# Patient Record
Sex: Female | Born: 1935 | Race: Black or African American | Smoking: Former smoker
Health system: Southern US, Community
[De-identification: ages and names within clinical notes are randomized; demographics above are authoritative.]

## PROBLEM LIST (undated history)

## (undated) DIAGNOSIS — K219 Gastro-esophageal reflux disease without esophagitis: Secondary | ICD-10-CM

## (undated) DIAGNOSIS — I219 Acute myocardial infarction, unspecified: Secondary | ICD-10-CM

## (undated) DIAGNOSIS — F419 Anxiety disorder, unspecified: Secondary | ICD-10-CM

## (undated) DIAGNOSIS — J309 Allergic rhinitis, unspecified: Secondary | ICD-10-CM

## (undated) DIAGNOSIS — M069 Rheumatoid arthritis, unspecified: Secondary | ICD-10-CM

## (undated) DIAGNOSIS — E785 Hyperlipidemia, unspecified: Secondary | ICD-10-CM

## (undated) DIAGNOSIS — G4700 Insomnia, unspecified: Secondary | ICD-10-CM

## (undated) DIAGNOSIS — E119 Type 2 diabetes mellitus without complications: Secondary | ICD-10-CM

## (undated) DIAGNOSIS — I82409 Acute embolism and thrombosis of unspecified deep veins of unspecified lower extremity: Secondary | ICD-10-CM

## (undated) DIAGNOSIS — I10 Essential (primary) hypertension: Secondary | ICD-10-CM

## (undated) DIAGNOSIS — I509 Heart failure, unspecified: Secondary | ICD-10-CM

## (undated) DIAGNOSIS — J449 Chronic obstructive pulmonary disease, unspecified: Secondary | ICD-10-CM

## (undated) DIAGNOSIS — I2510 Atherosclerotic heart disease of native coronary artery without angina pectoris: Secondary | ICD-10-CM

## (undated) DIAGNOSIS — M109 Gout, unspecified: Secondary | ICD-10-CM

## (undated) DIAGNOSIS — F329 Major depressive disorder, single episode, unspecified: Secondary | ICD-10-CM

## (undated) DIAGNOSIS — F32A Depression, unspecified: Secondary | ICD-10-CM

## (undated) DIAGNOSIS — J45909 Unspecified asthma, uncomplicated: Secondary | ICD-10-CM

## (undated) HISTORY — DX: Type 2 diabetes mellitus without complications: E11.9

## (undated) HISTORY — PX: BUNIONECTOMY: SHX129

## (undated) HISTORY — DX: Heart failure, unspecified: I50.9

## (undated) HISTORY — DX: Gout, unspecified: M10.9

## (undated) HISTORY — DX: Depression, unspecified: F32.A

## (undated) HISTORY — DX: Hyperlipidemia, unspecified: E78.5

## (undated) HISTORY — PX: OTHER SURGICAL HISTORY: SHX169

## (undated) HISTORY — DX: Unspecified asthma, uncomplicated: J45.909

## (undated) HISTORY — DX: Atherosclerotic heart disease of native coronary artery without angina pectoris: I25.10

## (undated) HISTORY — PX: COLONOSCOPY: SHX174

## (undated) HISTORY — DX: Acute embolism and thrombosis of unspecified deep veins of unspecified lower extremity: I82.409

## (undated) HISTORY — DX: Anxiety disorder, unspecified: F41.9

## (undated) HISTORY — DX: Acute myocardial infarction, unspecified: I21.9

## (undated) HISTORY — PX: TUBAL LIGATION: SHX77

## (undated) HISTORY — DX: Chronic obstructive pulmonary disease, unspecified: J44.9

## (undated) HISTORY — DX: Major depressive disorder, single episode, unspecified: F32.9

## (undated) HISTORY — PX: APPENDECTOMY: SHX54

## (undated) HISTORY — DX: Allergic rhinitis, unspecified: J30.9

## (undated) HISTORY — DX: Insomnia, unspecified: G47.00

## (undated) HISTORY — DX: Essential (primary) hypertension: I10

## (undated) HISTORY — DX: Gastro-esophageal reflux disease without esophagitis: K21.9

## (undated) HISTORY — PX: TONSILLECTOMY: SUR1361

## (undated) HISTORY — DX: Rheumatoid arthritis, unspecified: M06.9

---

## 2003-08-03 HISTORY — PX: BREAST LUMPECTOMY: SHX2

## 2012-05-19 ENCOUNTER — Ambulatory Visit (INDEPENDENT_AMBULATORY_CARE_PROVIDER_SITE_OTHER): Payer: Medicare Other | Admitting: Internal Medicine

## 2012-05-19 ENCOUNTER — Encounter: Payer: Self-pay | Admitting: Internal Medicine

## 2012-05-19 ENCOUNTER — Encounter: Payer: Self-pay | Admitting: Cardiology

## 2012-05-19 VITALS — BP 170/110 | HR 105 | Ht 65.0 in | Wt 126.4 lb

## 2012-05-19 DIAGNOSIS — I4891 Unspecified atrial fibrillation: Secondary | ICD-10-CM

## 2012-05-19 DIAGNOSIS — I701 Atherosclerosis of renal artery: Secondary | ICD-10-CM

## 2012-05-19 DIAGNOSIS — I10 Essential (primary) hypertension: Secondary | ICD-10-CM

## 2012-05-19 MED ORDER — AMLODIPINE BESYLATE 5 MG PO TABS: 5.0000 mg | ORAL_TABLET | Freq: Every day | ORAL | Status: AC

## 2012-05-19 MED ORDER — METOPROLOL SUCCINATE ER 50 MG PO TB24: 50.0000 mg | ORAL_TABLET | Freq: Every day | ORAL | Status: AC

## 2012-05-19 NOTE — Progress Notes (Signed)
HPI Patient is a 78 year old with a history of afib, CHF, CAD, COPD, rheumatoid arthritis.   She is followed by Katy Fitch FNP.  Has been seen in [place] by a cardiologist in past.  Says she has CAD but says she has never had heart catheterization.  Tried to make appt but with change in insurance can no longer be seen. She notes occasional chest heaviness.  No PND  Occasionally feels heart race, esp if She has a history of falling and is not considered a safe candidate for anticoagulaion.  She says she falls all the time.  Allergies no known allergies  Current Outpatient Prescriptions  Medication Sig Dispense Refill  . albuterol-ipratropium (COMBIVENT) 18-103 MCG/ACT inhaler Inhale 2 puffs into the lungs every 6 (six) hours as needed.      . cetirizine (ZYRTEC) 10 MG tablet Take 10 mg by mouth daily.      . Cholecalciferol (VITAMIN D) 2000 UNITS CAPS Take 2,000 Units by mouth daily.      Marland Kitchen dexamethasone (DECADRON) 0.75 MG tablet Take 1.5 mg by mouth daily with breakfast.      . diazepam (VALIUM) 2 MG tablet Take 2 mg by mouth at bedtime as needed.      . fentaNYL (DURAGESIC - DOSED MCG/HR) 75 MCG/HR Place 1 patch onto the skin every 3 (three) days.      . Fluticasone-Salmeterol (ADVAIR) 250-50 MCG/DOSE AEPB Inhale 1 puff into the lungs every 12 (twelve) hours.      . furosemide (LASIX) 20 MG tablet Take 20 mg by mouth daily.      Marland Kitchen gabapentin (NEURONTIN) 300 MG capsule Take 300 mg by mouth 2 (two) times daily.      . metoprolol succinate (TOPROL-XL) 25 MG 24 hr tablet Take 25 mg by mouth daily.      Marland Kitchen oxybutynin (DITROPAN) 5 MG tablet Take 5 mg by mouth 2 (two) times daily.      Marland Kitchen oxyCODONE-acetaminophen (PERCOCET) 10-650 MG per tablet Take 1 tablet by mouth every 6 (six) hours as needed.      . pantoprazole (PROTONIX) 40 MG tablet Take 40 mg by mouth daily.      . potassium chloride (K-DUR,KLOR-CON) 10 MEQ tablet Take 10 mEq by mouth daily.      . sertraline (ZOLOFT) 50 MG tablet Take 50 mg  by mouth daily.      Marland Kitchen zolpidem (AMBIEN) 5 MG tablet Take 5 mg by mouth at bedtime as needed.        Past Medical History  Diagnosis Date  . Hypertension   . Hyperlipidemia   . Diabetes mellitus, type 2   . Depression   . Anxiety   . GERD (gastroesophageal reflux disease)   . Allergic rhinitis   . Asthma   . COPD (chronic obstructive pulmonary disease)   . CHF (congestive heart failure)     Diastolic dysfunction  . CAD (coronary artery disease)   . Insomnia   . Gout   . RA (rheumatoid arthritis)   . MI (myocardial infarction)     09/1999  . DVT (deep venous thrombosis)     2008    Past Surgical History  Procedure Date  . Colonoscopy   . Bunionectomy   . Appendectomy   . Bilateral carpal tunnel   . Catarct surgery   . Breast lumpectomy 2005  . Tonsillectomy   . Tubal ligation     No family history on file.  History   Social  History  . Marital Status: Unknown    Spouse Name: N/A    Number of Children: 2  . Years of Education: N/A   Occupational History  . Retired    Social History Main Topics  . Smoking status: Never Smoker   . Smokeless tobacco: Not on file  . Alcohol Use: No  . Drug Use: No  . Sexually Active: Not on file   Other Topics Concern  . Not on file   Social History Narrative  . No narrative on file    Review of Systems:  All systems reviewed.  They are negative to the above problem except as previously stated.  Vital Signs: BP is 170/110   P105  Wt 126 Lb.  Physical Exam Patient is a thin 78 year old in NAD  Examined in wheelchair. HEENT:  Normocephalic, atraumatic. EOMI, PERRLA.  Neck: JVP is normal.  No bruits.  Lungs: clear to auscultation. No rales no wheezes.  Heart: Irregular rate and rhythm. Normal S1, S2. No S3.   No significant murmurs. PMI not displaced.  Abdomen:  Supple, nontender. Normal bowel sounds. No masses. No hepatomegaly.  Extremities:   Good distal pulses throughout. No lower extremity edema.    Musculoskeletal :moving all extremities.  Neuro:   alert and oriented x3.  CN II-XII grossly intact.  EKG:   Atrial fibrillation  105 bpm.  Septal MI.   Assessment and Plan:  Patient is a 78 year old with multiple medical problems.  WIll get labs from [facility].  Will aslo get records from [facility]. She has longstanding afib.  Is not on coumadin or even an aspirin.  I would recomm 325 ecASA unless contraindicated for other reasons. Will schedule echo.  Consider Holter monitor but will review records first  2.  CAD.  Patient with reported history  Does have intermitt heaviness in chest.  I am not convinced angina, may reflect higher HR and diastolic dysfunction in setting of COPD Volume today looks good I would like to review outside records as well as echo.  3.  HTN  BP has been good until the past couple months.  Very high today.  WIll get Renal Artery USN.  Increase toprol to 50.  Add 5 Norvasc.  F/U in 4 wks.  4. COPD  Continue oxygen  5.  ?Dyslipidemia  WIll review outside labs.  Review of records from [facility] it appear that she has been conservative in Rx.  Will discuss all when get test results and records. Marland Kitchen

## 2012-05-19 NOTE — Patient Instructions (Addendum)
Your physician recommends that you schedule a follow-up appointment in: 1 month  Your physician has requested that you have an echocardiogram. Echocardiography is a painless test that uses sound waves to create images of your heart. It provides your doctor with information about the size and shape of your heart and how well your heart's chambers and valves are working. This procedure takes approximately one hour. There are no restrictions for this procedure.  Your physician has requested that you have a renal artery duplex. During this test, an ultrasound is used to evaluate blood flow to the kidneys. Allow one hour for this exam. Do not eat after midnight the day before and avoid carbonated beverages. Take your medications as you usually do.  Your physician has recommended you make the following change in your medication:  1 - INCREASE Metoprolol to 50 mg daily 2 - START Amlodipine 5 mg daily

## 2012-05-24 ENCOUNTER — Other Ambulatory Visit: Payer: Self-pay | Admitting: Cardiology

## 2012-05-24 DIAGNOSIS — I10 Essential (primary) hypertension: Secondary | ICD-10-CM

## 2012-05-29 ENCOUNTER — Encounter (INDEPENDENT_AMBULATORY_CARE_PROVIDER_SITE_OTHER): Payer: Medicare Other

## 2012-05-29 DIAGNOSIS — I10 Essential (primary) hypertension: Secondary | ICD-10-CM

## 2012-06-02 ENCOUNTER — Ambulatory Visit (HOSPITAL_COMMUNITY)
Admission: RE | Admit: 2012-06-02 | Discharge: 2012-06-02 | Disposition: A | Discharge status: Home / Self Care | Payer: Medicare Other | Source: Ambulatory Visit | Attending: Internal Medicine | Admitting: Internal Medicine

## 2012-06-02 DIAGNOSIS — I701 Atherosclerosis of renal artery: Secondary | ICD-10-CM | POA: Insufficient documentation

## 2012-06-02 DIAGNOSIS — I4891 Unspecified atrial fibrillation: Secondary | ICD-10-CM

## 2012-06-02 DIAGNOSIS — I10 Essential (primary) hypertension: Secondary | ICD-10-CM

## 2012-06-02 NOTE — Progress Notes (Signed)
*  PRELIMINARY RESULTS* Echocardiogram 2D Echocardiogram has been performed.  Breanna Reed 06/02/2012, 9:47 AM

## 2012-06-26 ENCOUNTER — Telehealth: Payer: Self-pay | Admitting: *Deleted

## 2012-06-26 MED ORDER — ATORVASTATIN CALCIUM 10 MG PO TABS: 10.0000 mg | ORAL_TABLET | Freq: Every day | ORAL | Status: AC

## 2012-06-26 NOTE — Telephone Encounter (Signed)
Called patient with follow up from lab work results from Dr. Ninfa Linden from 03/29/2012 and advised that she needs to start Lipitor 10mg  every day and have repeat fasting labs/ast in 8 weeks per Dr.Ross. She will set up lab appointment at the December appointment in [facility].

## 2012-07-07 ENCOUNTER — Ambulatory Visit: Payer: Medicare Other | Admitting: Internal Medicine

## 2012-08-03 ENCOUNTER — Telehealth: Payer: Self-pay | Admitting: Internal Medicine

## 2012-08-03 ENCOUNTER — Ambulatory Visit: Payer: Medicare Other | Admitting: Internal Medicine

## 2012-08-03 NOTE — Telephone Encounter (Signed)
Patient no showed appointment.  Called patient to reschedule.  No answer and no machine. Letter mailed. / tgs

## 2012-09-05 ENCOUNTER — Encounter: Payer: Self-pay | Admitting: Internal Medicine

## 2012-11-03 ENCOUNTER — Ambulatory Visit (INDEPENDENT_AMBULATORY_CARE_PROVIDER_SITE_OTHER): Payer: Medicare Other | Admitting: Internal Medicine

## 2012-11-03 ENCOUNTER — Encounter: Payer: Self-pay | Admitting: Internal Medicine

## 2012-11-03 VITALS — BP 134/83 | HR 79 | Ht 65.0 in | Wt 128.8 lb

## 2012-11-03 DIAGNOSIS — I25810 Atherosclerosis of coronary artery bypass graft(s) without angina pectoris: Secondary | ICD-10-CM

## 2012-11-03 DIAGNOSIS — I10 Essential (primary) hypertension: Secondary | ICD-10-CM

## 2012-11-03 DIAGNOSIS — R55 Syncope and collapse: Secondary | ICD-10-CM

## 2012-11-03 NOTE — Progress Notes (Addendum)
HPI Patient is a 79 year old with a history of afib, CHF, CAD, COPD, rheumatoid arthritis Echo in November showed normal LV systolic function.   SInce I saw her she was hosp at [facility].  She was d/c'd 10/10/12.  Reports she had a blood clot in her lung  Also a blood clot in her leg.  Had surgery  Now on Xarelto.   She says she gets chest pressure a few times per month.   Alos feels her heart pounding a few times per week She has had a few syncopal spells  Last was a few months ago  Sitting doing puzzle  Woke up on floor.  No prodrome Aonther spell she was walking to [place].   No records available from [facility] Allergies  Allergen Reactions  . Prednisone     vomitting,diarrhea    Current Outpatient Prescriptions  Medication Sig Dispense Refill  . albuterol-ipratropium (COMBIVENT) 18-103 MCG/ACT inhaler Inhale 2 puffs into the lungs every 6 (six) hours as needed.      Marland Kitchen amLODipine (NORVASC) 5 MG tablet Take 1 tablet (5 mg total) by mouth daily.  30 tablet  6  . atorvastatin (LIPITOR) 10 MG tablet Take 1 tablet (10 mg total) by mouth daily.  30 tablet  6  . cetirizine (ZYRTEC) 10 MG tablet Take 10 mg by mouth daily.      . Cholecalciferol (VITAMIN D) 2000 UNITS CAPS Take 2,000 Units by mouth daily.      Marland Kitchen dexamethasone (DECADRON) 0.75 MG tablet Take 1.5 mg by mouth daily with breakfast.      . diazepam (VALIUM) 2 MG tablet Take 2 mg by mouth at bedtime as needed.      . fentaNYL (DURAGESIC - DOSED MCG/HR) 75 MCG/HR Place 1 patch onto the skin every 3 (three) days.      . Fluticasone-Salmeterol (ADVAIR) 250-50 MCG/DOSE AEPB Inhale 1 puff into the lungs every 12 (twelve) hours.      . furosemide (LASIX) 20 MG tablet Take 20 mg by mouth daily.      Marland Kitchen gabapentin (NEURONTIN) 300 MG capsule Take 300 mg by mouth 2 (two) times daily.      Marland Kitchen HYDROcodone-acetaminophen (NORCO/VICODIN) 5-325 MG per tablet       . lidocaine (LIDODERM) 5 % Place 1 patch onto the skin as needed. Remove & Discard  patch within 12 hours or as directed by MD      . metoprolol succinate (TOPROL-XL) 50 MG 24 hr tablet Take 1 tablet (50 mg total) by mouth daily.  30 tablet  6  . oxybutynin (DITROPAN) 5 MG tablet Take 5 mg by mouth 2 (two) times daily.      Marland Kitchen oxyCODONE-acetaminophen (PERCOCET) 10-650 MG per tablet Take 1 tablet by mouth every 6 (six) hours as needed.      . pantoprazole (PROTONIX) 40 MG tablet Take 40 mg by mouth daily.      . potassium chloride (K-DUR,KLOR-CON) 10 MEQ tablet Take 10 mEq by mouth daily.      . sertraline (ZOLOFT) 50 MG tablet Take 50 mg by mouth daily.      Marland Kitchen warfarin (COUMADIN) 4 MG tablet Take 4 mg by mouth daily.       Carlena Hurl 15 MG TABS tablet       . zolpidem (AMBIEN) 5 MG tablet Take 5 mg by mouth at bedtime as needed.       No current facility-administered medications for this visit.  Past Medical History  Diagnosis Date  . Hypertension   . Hyperlipidemia   . Diabetes mellitus, type 2   . Depression   . Anxiety   . GERD (gastroesophageal reflux disease)   . Allergic rhinitis   . Asthma   . COPD (chronic obstructive pulmonary disease)   . CHF (congestive heart failure)     Diastolic dysfunction  . CAD (coronary artery disease)   . Insomnia   . Gout   . RA (rheumatoid arthritis)   . MI (myocardial infarction)     09/1999  . DVT (deep venous thrombosis)     2008    Past Surgical History  Procedure Laterality Date  . Colonoscopy    . Bunionectomy    . Appendectomy    . Bilateral carpal tunnel    . Catarct surgery    . Breast lumpectomy  2005  . Tonsillectomy    . Tubal ligation      No family history on file.  History   Social History  . Marital Status: Widowed    Spouse Name: N/A    Number of Children: 2  . Years of Education: N/A   Occupational History  . Retired    Social History Main Topics  . Smoking status: Former Smoker -- 0.50 packs/day for 29 years    Types: Cigarettes    Quit date: 08/03/1979  . Smokeless tobacco: Not  on file  . Alcohol Use: No  . Drug Use: No  . Sexually Active: Not on file   Other Topics Concern  . Not on file   Social History Narrative  . No narrative on file    Review of Systems:  All systems reviewed.  They are negative to the above problem except as previously stated.  Vital Signs: BP 134/83  Pulse 79  Ht 5\' 5"  (1.651 m)  Wt 128 lb 12 oz (58.401 kg)  BMI 21.43 kg/m2  SpO2 96%  Physical Exam Patient is in NAD HEENT:  Normocephalic, atraumatic. EOMI, PERRLA.  Neck: JVP is normal.  No bruits.  Lungs: clear to auscultation. No rales no wheezes.  Heart: Regular rate and rhythm. Normal S1, S2. No S3.   No significant murmurs. PMI not displaced.  Abdomen:  Supple, nontender. Normal bowel sounds. No masses. No hepatomegaly.  Extremities:   Good distal pulses throughout. No lower extremity edema.  Musculoskeletal :moving all extremities.  Neuro:   alert and oriented x3.  CN II-XII grossly intact. EKG:  SR with frequent PACs 83 bpm   Anteroseptal MI  Assessment and Plan:  Patient was recently hospitalized at [facility] not available at visit [facility] currently denies CP  Breathing is stable. BP is adequately controlled.  Need to get records to review hospital course.  I would not change medical regimen.  Take activity as tolerated.   11/07/12  Reviewed records from [facility]  Patient admitted 3/4 with pain LLE  With cold LLE Found to have thrombosis of leg Underwent revasc with stent.  Hypercoag w/u was negative.  Echo showed normal LV systolic function.  Mild MR, mild TR, mild AI  Mild diastolic dysfunction.  I would keep on regimen. Noted to have FE defic anemia  WIl need to be followed.

## 2012-11-03 NOTE — Patient Instructions (Addendum)
Your physician recommends that you schedule a follow-up appointment in: Dr Tenny Craw will call with follow up

## 2012-11-06 NOTE — Addendum Note (Signed)
Addended by: Cynda Acres A on: 11/06/2012 10:15 AM   Modules accepted: Orders

## 2012-11-07 ENCOUNTER — Encounter: Payer: Self-pay | Admitting: *Deleted

## 2012-11-07 ENCOUNTER — Other Ambulatory Visit: Payer: Self-pay | Admitting: *Deleted

## 2012-11-07 DIAGNOSIS — D649 Anemia, unspecified: Secondary | ICD-10-CM

## 2013-01-12 ENCOUNTER — Other Ambulatory Visit: Payer: Self-pay | Admitting: Internal Medicine

## 2013-01-13 LAB — CBC
Hemoglobin: 10.4 g/dL — ABNORMAL LOW (ref 12.0–15.0)
MCH: 30.9 pg (ref 26.0–34.0)
MCV: 95.8 fL (ref 78.0–100.0)
Platelets: 260 10*3/uL (ref 150–400)
RBC: 3.37 MIL/uL — ABNORMAL LOW (ref 3.87–5.11)
WBC: 11.4 10*3/uL — ABNORMAL HIGH (ref 4.0–10.5)

## 2013-01-13 LAB — IRON AND TIBC
TIBC: 336 ug/dL (ref 250–470)
UIBC: 269 ug/dL (ref 125–400)

## 2013-01-13 LAB — FERRITIN: Ferritin: 55 ng/mL (ref 10–291)

## 2013-02-05 ENCOUNTER — Ambulatory Visit: Payer: Medicare Other | Admitting: Internal Medicine

## 2013-03-11 ENCOUNTER — Inpatient Hospital Stay: Payer: Self-pay | Admitting: Internal Medicine

## 2013-03-11 LAB — CBC WITH DIFFERENTIAL/PLATELET
Basophil %: 0.2 %
Eosinophil #: 0 10*3/uL (ref 0.0–0.7)
Eosinophil %: 0.2 %
HGB: 8.9 g/dL — ABNORMAL LOW (ref 12.0–16.0)
Lymphocyte #: 0.7 10*3/uL — ABNORMAL LOW (ref 1.0–3.6)
MCV: 89 fL (ref 80–100)
Monocyte #: 0.7 x10 3/mm (ref 0.2–0.9)
Neutrophil #: 17.8 10*3/uL — ABNORMAL HIGH (ref 1.4–6.5)
Platelet: 338 10*3/uL (ref 150–440)

## 2013-03-11 LAB — COMPREHENSIVE METABOLIC PANEL
Albumin: 2.5 g/dL — ABNORMAL LOW (ref 3.4–5.0)
Alkaline Phosphatase: 127 U/L (ref 50–136)
BUN: 25 mg/dL — ABNORMAL HIGH (ref 7–18)
Chloride: 105 mmol/L (ref 98–107)
Creatinine: 2.34 mg/dL — ABNORMAL HIGH (ref 0.60–1.30)
EGFR (Non-African Amer.): 19 — ABNORMAL LOW
Osmolality: 280 (ref 275–301)
Potassium: 5.1 mmol/L (ref 3.5–5.1)
SGPT (ALT): 9 U/L — ABNORMAL LOW (ref 12–78)
Sodium: 137 mmol/L (ref 136–145)

## 2013-03-11 LAB — URINALYSIS, COMPLETE
Bacteria: NONE SEEN
Bilirubin,UR: NEGATIVE
Blood: NEGATIVE
Glucose,UR: NEGATIVE mg/dL (ref 0–75)
Ketone: NEGATIVE
Nitrite: NEGATIVE
Ph: 5 (ref 4.5–8.0)
Protein: NEGATIVE
Squamous Epithelial: 1

## 2013-03-11 LAB — CBC
HCT: 29.1 % — ABNORMAL LOW (ref 35.0–47.0)
HGB: 9.8 g/dL — ABNORMAL LOW (ref 12.0–16.0)
MCH: 29.6 pg (ref 26.0–34.0)
MCV: 88 fL (ref 80–100)
Platelet: 499 10*3/uL — ABNORMAL HIGH (ref 150–440)
RBC: 3.3 10*6/uL — ABNORMAL LOW (ref 3.80–5.20)
RDW: 16.4 % — ABNORMAL HIGH (ref 11.5–14.5)

## 2013-03-11 LAB — TROPONIN I: Troponin-I: <0.02 ng/mL

## 2013-03-11 LAB — BASIC METABOLIC PANEL
Anion Gap: 14 (ref 7–16)
Chloride: 110 mmol/L — ABNORMAL HIGH (ref 98–107)
Co2: 16 mmol/L — ABNORMAL LOW (ref 21–32)
EGFR (Non-African Amer.): 23 — ABNORMAL LOW
Glucose: 139 mg/dL — ABNORMAL HIGH (ref 65–99)
Osmolality: 285 (ref 275–301)
Potassium: 4.6 mmol/L (ref 3.5–5.1)
Sodium: 140 mmol/L (ref 136–145)

## 2013-03-12 LAB — URINE CULTURE

## 2013-03-12 LAB — MAGNESIUM: Magnesium: 1.7 mg/dL — ABNORMAL LOW

## 2013-03-12 LAB — CBC WITH DIFFERENTIAL/PLATELET
Basophil #: 0 10*3/uL (ref 0.0–0.1)
Basophil %: 0.4 %
HGB: 8 g/dL — ABNORMAL LOW (ref 12.0–16.0)
Lymphocyte %: 5.7 %
MCH: 29.8 pg (ref 26.0–34.0)
MCV: 88 fL (ref 80–100)
Monocyte #: 0.7 x10 3/mm (ref 0.2–0.9)
Monocyte %: 5.7 %
RBC: 2.68 10*6/uL — ABNORMAL LOW (ref 3.80–5.20)
RDW: 16.3 % — ABNORMAL HIGH (ref 11.5–14.5)
WBC: 13 10*3/uL — ABNORMAL HIGH (ref 3.6–11.0)

## 2013-03-12 LAB — BASIC METABOLIC PANEL
Calcium, Total: 7.9 mg/dL — ABNORMAL LOW (ref 8.5–10.1)
Chloride: 111 mmol/L — ABNORMAL HIGH (ref 98–107)
Co2: 18 mmol/L — ABNORMAL LOW (ref 21–32)
Creatinine: 1.36 mg/dL — ABNORMAL HIGH (ref 0.60–1.30)
EGFR (African American): 43 — ABNORMAL LOW
EGFR (Non-African Amer.): 37 — ABNORMAL LOW
Sodium: 142 mmol/L (ref 136–145)

## 2013-03-13 LAB — PROTIME-INR
INR: 2.4
Prothrombin Time: 25.3 secs — ABNORMAL HIGH (ref 11.5–14.7)

## 2013-03-14 LAB — PROTIME-INR: Prothrombin Time: 26.9 secs — ABNORMAL HIGH (ref 11.5–14.7)

## 2013-03-15 LAB — PROTIME-INR: INR: 2.9

## 2013-03-16 LAB — CULTURE, BLOOD (SINGLE)

## 2013-03-16 LAB — PROTIME-INR: Prothrombin Time: 24 secs — ABNORMAL HIGH (ref 11.5–14.7)

## 2013-04-24 ENCOUNTER — Encounter: Payer: Self-pay | Admitting: *Deleted

## 2014-11-10 IMAGING — CR DG CHEST 1V PORT
1 series · 1 of 1 positions shown · non-contrast
Comparison: none

REASON FOR EXAM: s/p intubation
COMMENTS:

[ap]
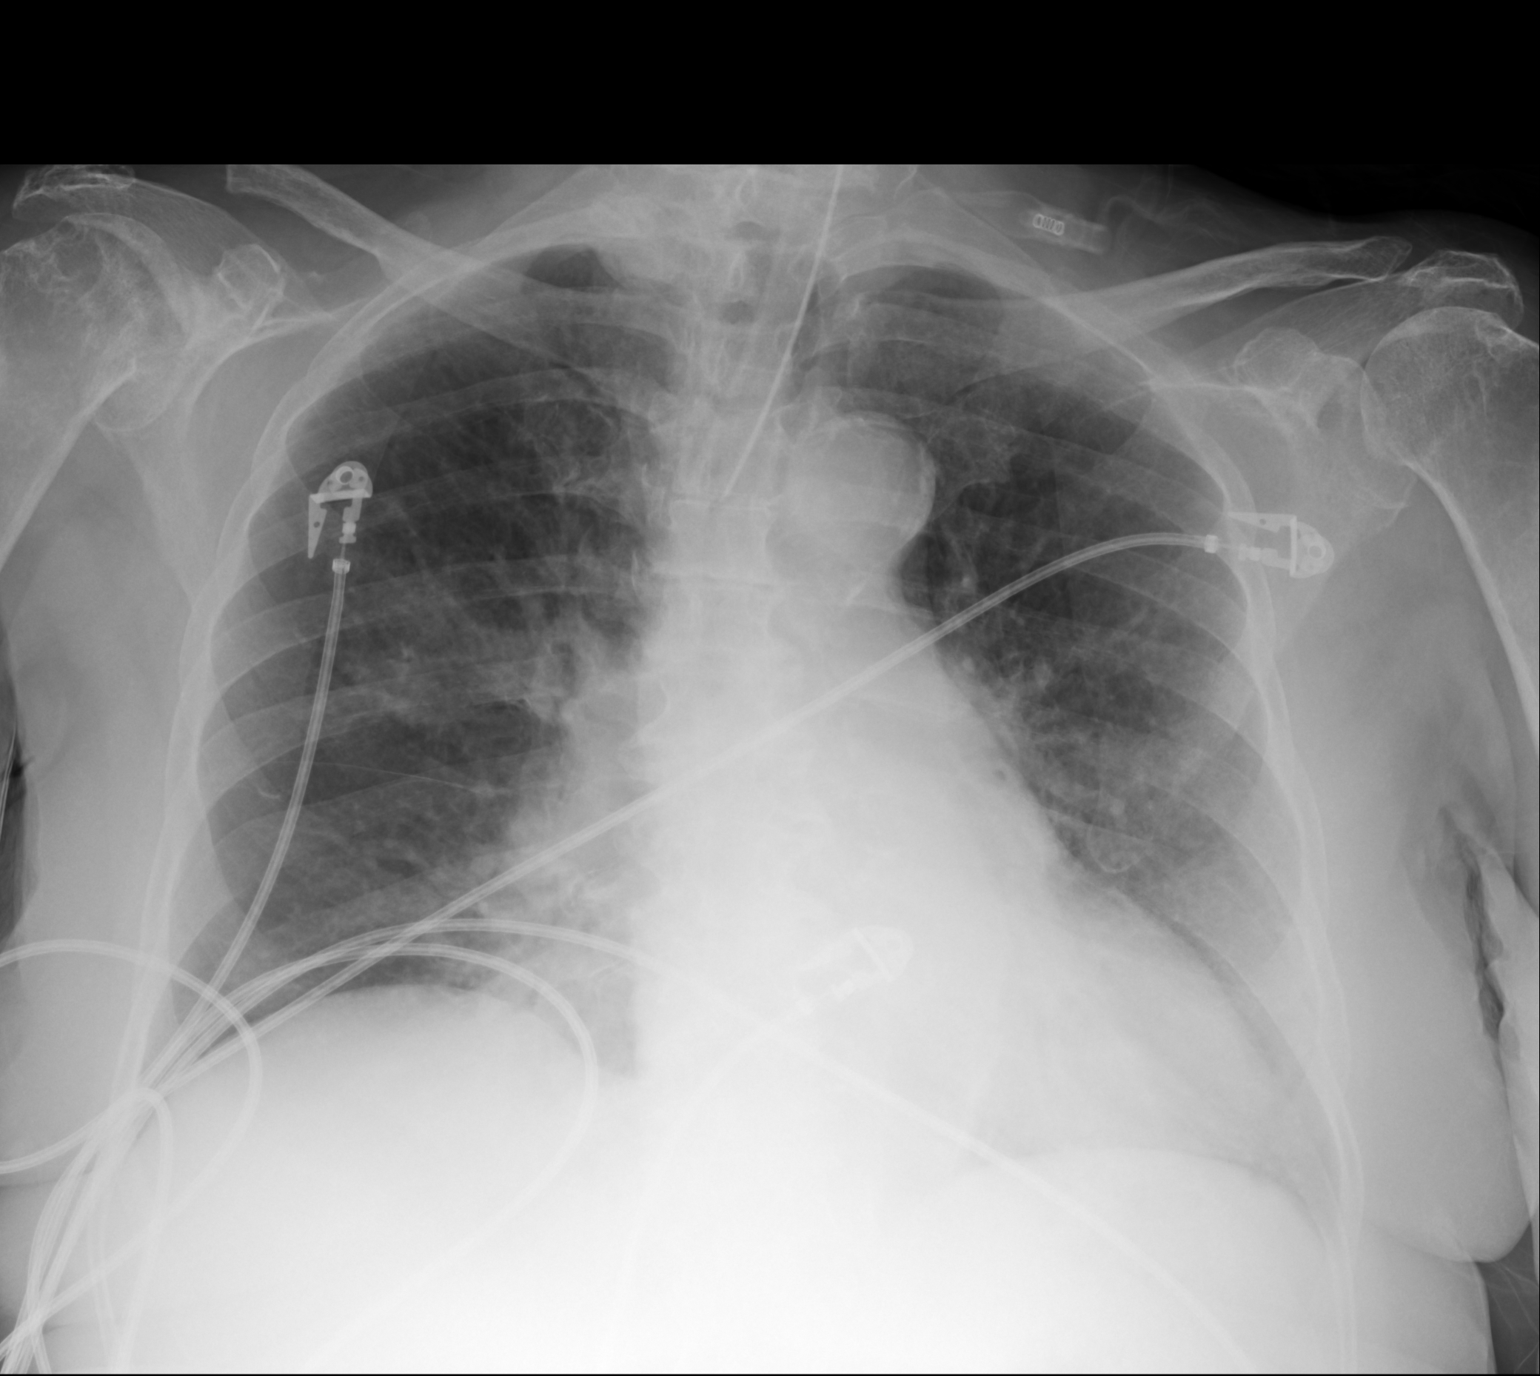

[1 of 1 positions shown; findings below may reference images not displayed]

PROCEDURE:     DXR - DXR PORTABLE CHEST SINGLE VIEW  - March 11, 2013  [DATE]

RESULT:     An endotracheal tube is in place. The tip lies approximately 1
cm below the inferior margin of the clavicular heads. The cardiac silhouette
is enlarged. There is increased density in the right perihilar region. There
is no pleural effusion or pneumothorax. There is tortuosity of the
descending thoracic aorta.
IMPRESSION: 1. The trachea is intubated with the tip of the tube approximately 1 cm
below the inferior margin of the clavicular heads an approximately 3 cm
above the carina.
2. There is mild enlargement of the cardiac silhouette with indistinct
pulmonary vascularity.
3. Confluent density in the right midlung is present. This could reflect
aspiration. Followup films would be useful to reassess the right lung.

[REDACTED]

## 2014-11-10 IMAGING — CR DG TIBIA/FIBULA 2V*L*
1 series · 2 of 2 positions shown · non-contrast
Comparison: none

REASON FOR EXAM: wound vac, concern for infection
COMMENTS:

[Series 1: ap · 0.17mm/px · 2 of 2 slices shown]
[im 1/2]
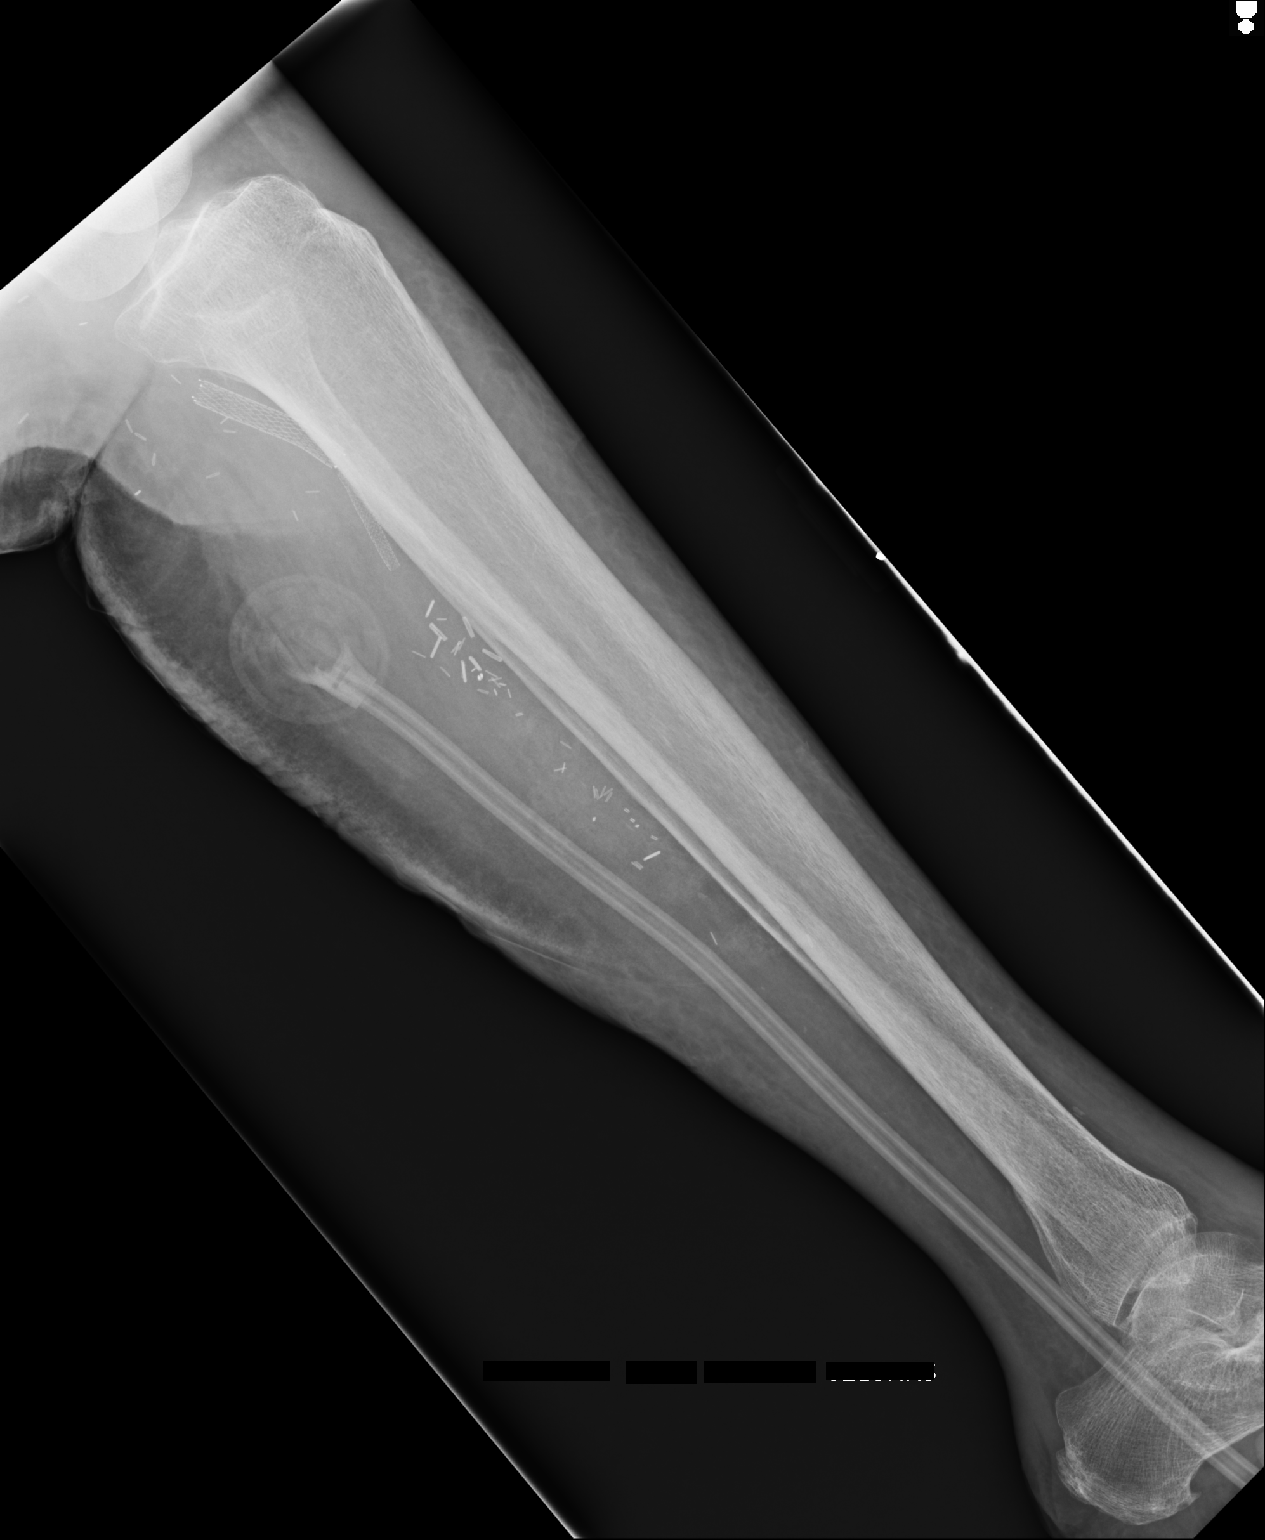
[im 2/2]
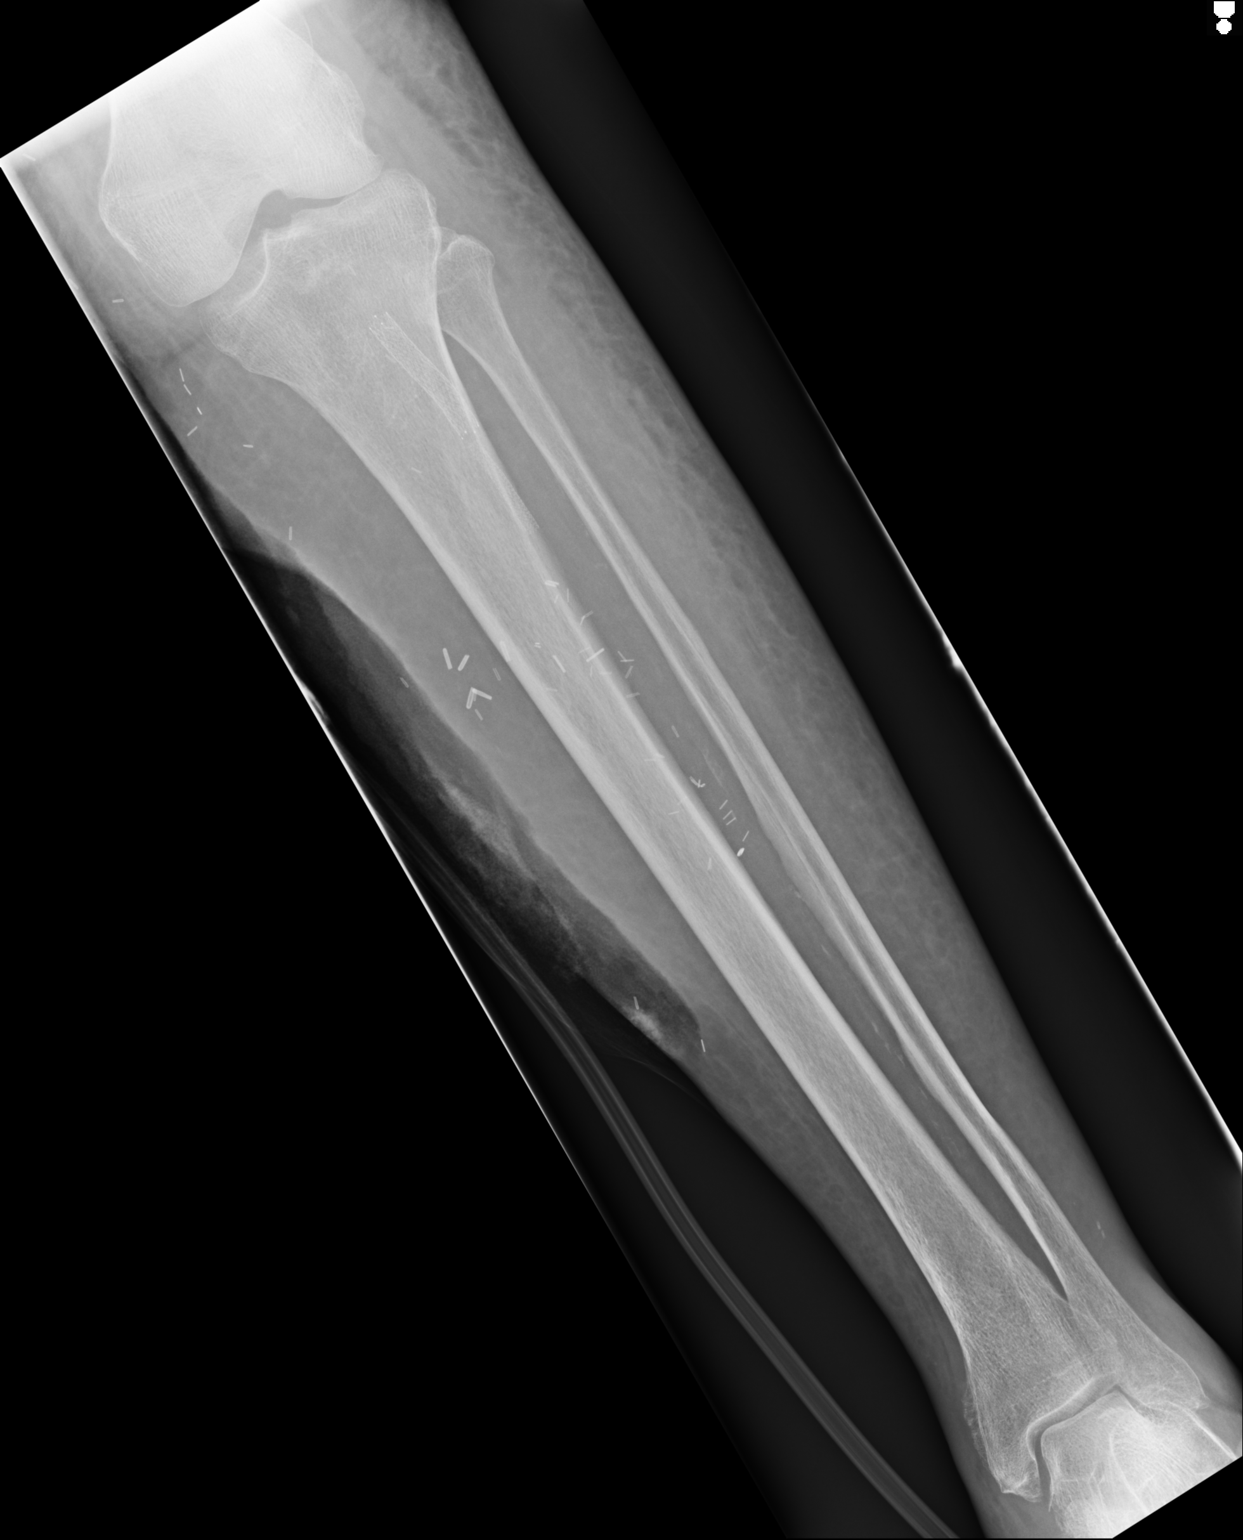

[2 of 2 positions shown; findings below may reference images not displayed]

PROCEDURE:     DXR - DXR TIBIA AND FIBULA LT (LOWER L  - March 11, 2013  [DATE]

RESULT:     AP and lateral views of the left tibia and fibula reveal the
bones to be adequately mineralized. There is no lytic or blastic lesion.
There is no evidence of a fracture. The patient has wound vacuum device in
place for treatment of significant soft tissue abnormality over the medial
aspect of the leg. Innumerable surgical vascular clips are present.
IMPRESSION: No acute bony abnormality of the left tibia or fibula is
demonstrated. The overlying soft tissues exhibit abnormality medially
consistent with cellulitis.

[REDACTED]

## 2014-11-10 IMAGING — CT CT HEAD WITHOUT CONTRAST
1 series · 15 of 30 positions shown, 19 images · non-contrast
Comparison: none

REASON FOR EXAM: ams
COMMENTS:

[Series 2: soft tissue · axial · 0.39mm/px · z∈[+2,+138]mm · 15 of 30 slices shown, 19 images]
[im 2/30  brain]
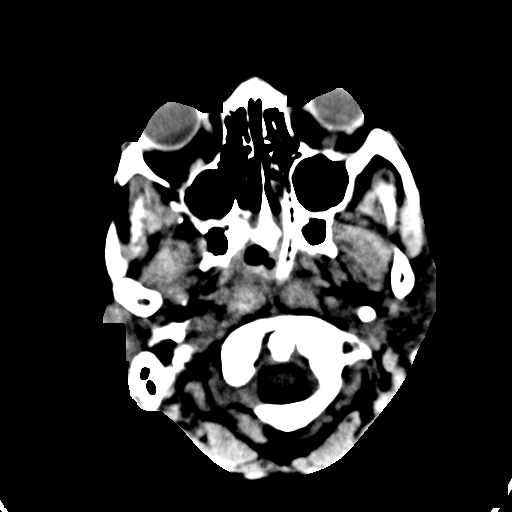
[im 2/30  bone]
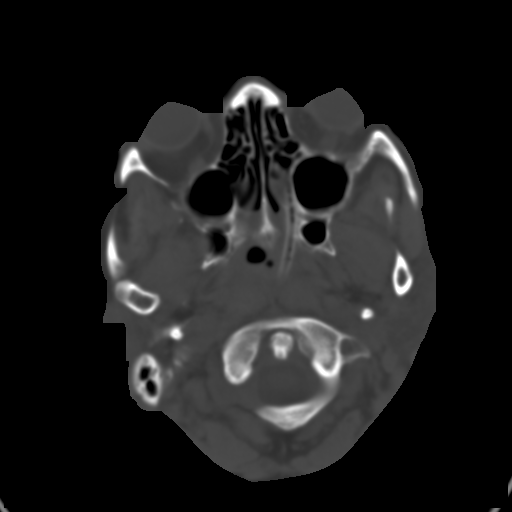
[im 4/30  brain]
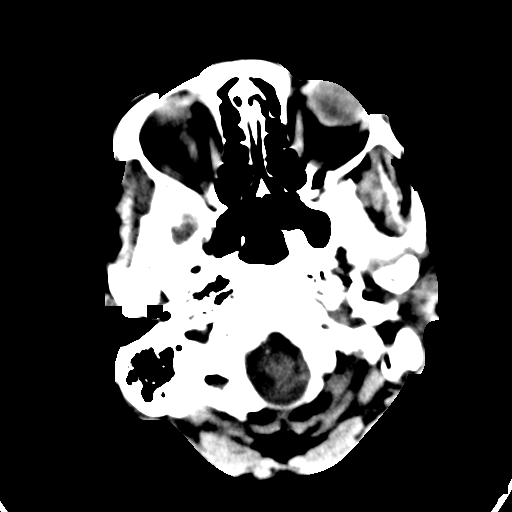
[im 6/30  brain]
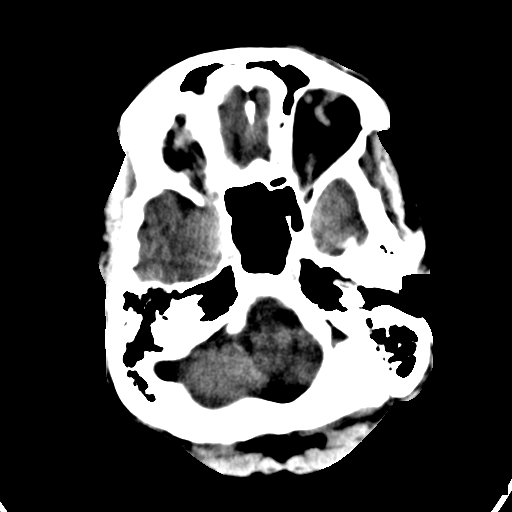
[im 8/30  brain]
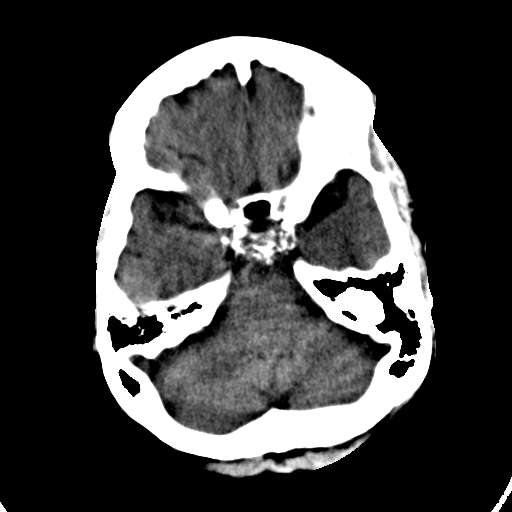
[im 10/30  brain]
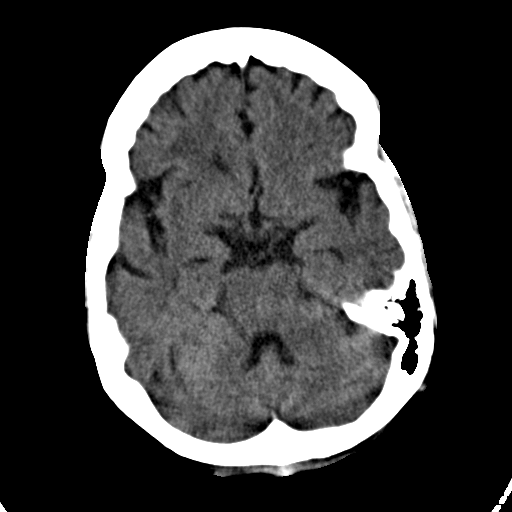
[im 10/30  bone]
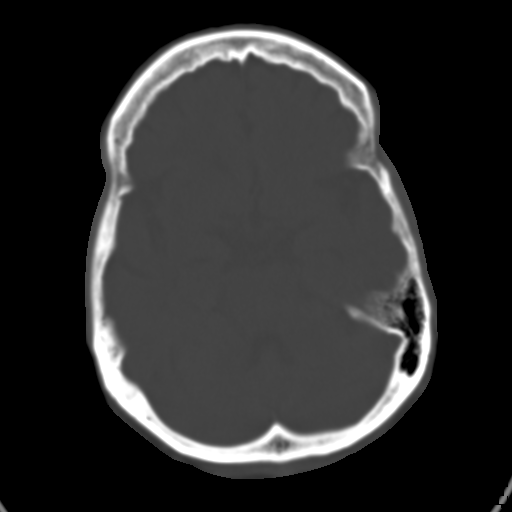
[im 12/30  brain]
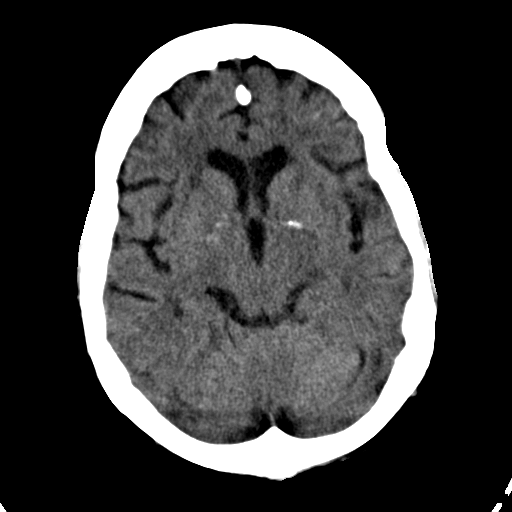
[im 14/30  brain]
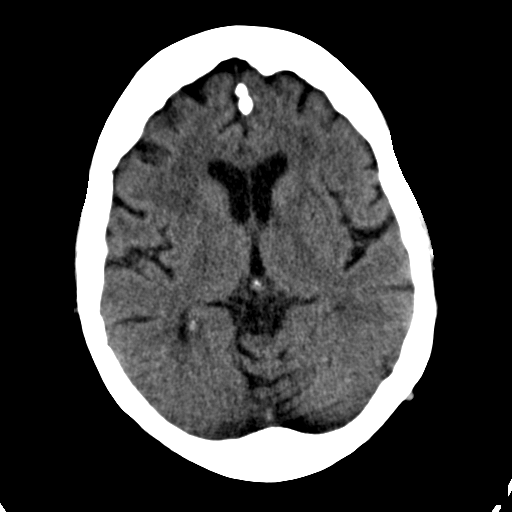
[im 16/30  brain]
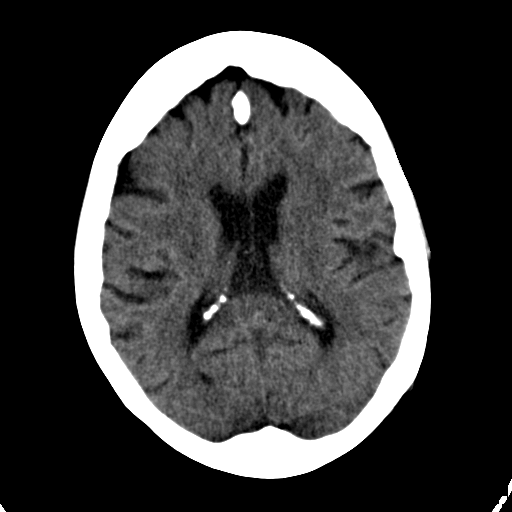
[im 17/30  brain]
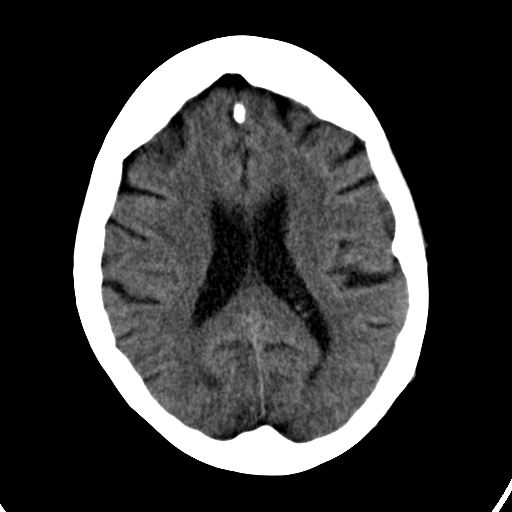
[im 17/30  bone]
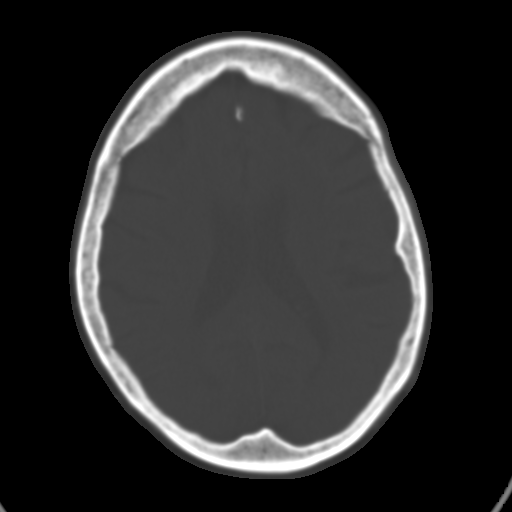
[im 19/30  brain]
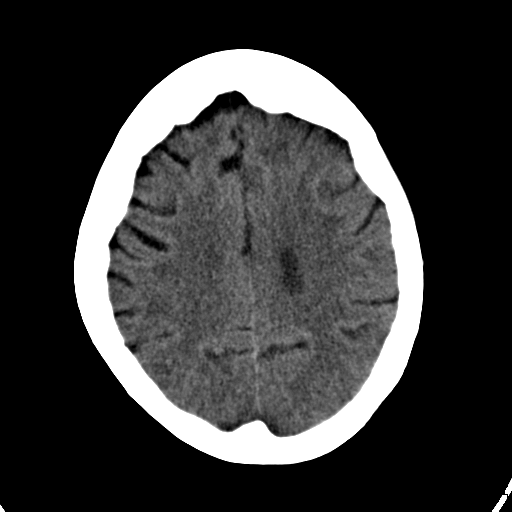
[im 21/30  brain]
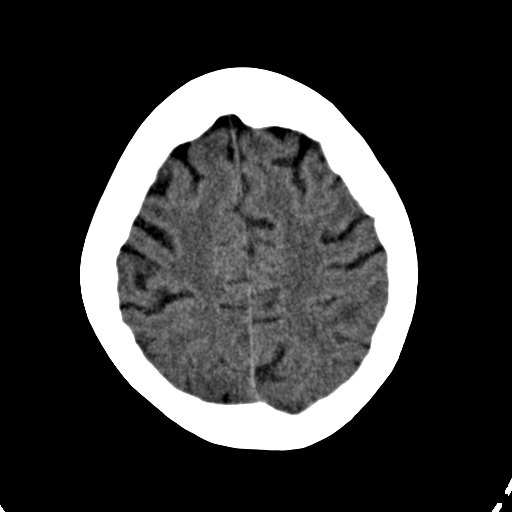
[im 23/30  brain]
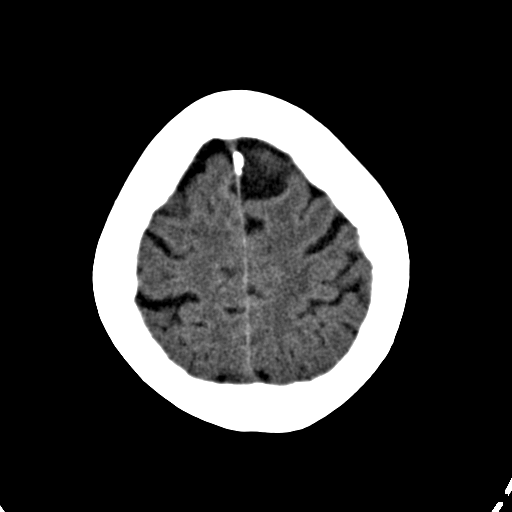
[im 25/30  brain]
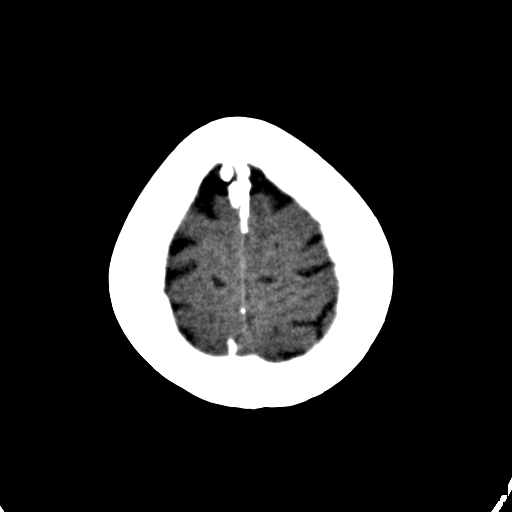
[im 25/30  bone]
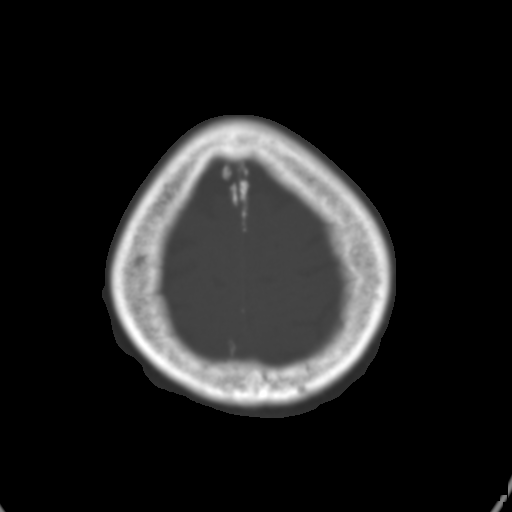
[im 27/30  brain]
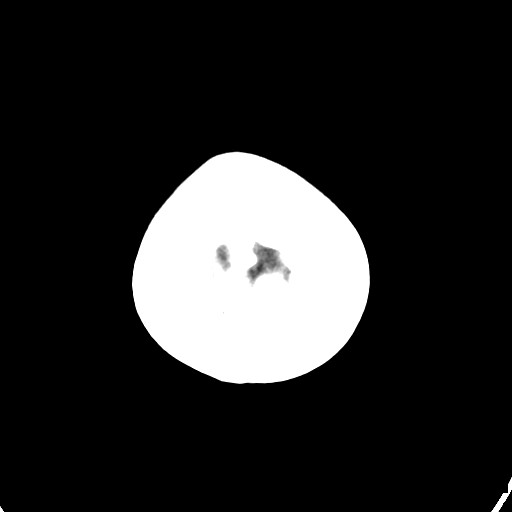
[im 29/30  brain]
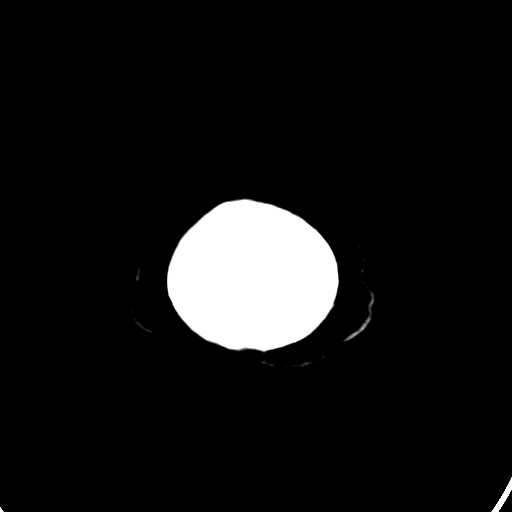

[15 of 30 positions shown; findings below may reference images not displayed]

PROCEDURE:     CT  - CT HEAD WITHOUT CONTRAST  - March 11, 2013  [DATE]

RESULT:     Axial noncontrast CT scanning was performed through the brain
with reconstructions at 5 mm intervals and slice thicknesses.

There is mild age-appropriate diffuse cerebral and cerebellar atrophy with
compensatory ventriculomegaly. There is no evidence of an acute intracranial
hemorrhage. There are no findings to suggest an evolving ischemic
infarction. The cerebellum and brainstem exhibit normal density. Is a small
amount of calcification in the anterior aspect of the falx cerebri which is
a normal finding.

At bone window settings there is a tubular structure in the right nasal
passage that may reflect a naso- gastric tube. There is fluid soft tissue
density in the posterior aspect of the nasal passages. The paranasal sinuses
and mastoid air cells are clear. There is no evidence of an acute skull
fracture.
IMPRESSION: 1. There are mild age related atrophic changes within the brain as well as
evidence of chronic small vessel ischemia. There is no evidence of an acute
intracranial hemorrhage.
2. There is no evidence of an acute skull fracture.
3. There is soft tissue density material in the posterior aspect of the
nasal passages likely related to the presence of the presumed nasogastric
tube.

A preliminary report was sent to the [HOSPITAL] the conclusion
of the study.

[REDACTED]

## 2014-11-22 NOTE — H&P (Signed)
PATIENT NAME:  Breanna Reed, [name] MR#:  [id number] DATE OF BIRTH:  1932/09/15  DATE OF ADMISSION:  03/11/2013  PRIMARY CARE PHYSICIAN:  At [facility].  Previous primary care [facility] is [facility].   SURGERY:  [facility].  REFERRING PHYSICIAN:  Dr. Cyril LoosenKinner.   CHIEF COMPLAINT:  Altered mental status.   HISTORY OF PRESENT ILLNESS:  The patient is a 81 year old African American female with a past medical history of diabetes mellitus type 2, COPD, hypertension, hyperlipidemia, recent history of peripheral vascular disease status post recent left femoral popliteal bypass surgery and wound vac on Coumadin for left lower leg ischemia, is sent over to the ER from the [facility] for altered mentation.  According to the nursing home staff and ER staff, the patient was found to be lethargic in the nursing home and at that time her pulse was between 100 to 130 and pulse ox was 70% to 80%.  Immediately paramedics were called and the patient was brought into the ER.  The patient was found to be unresponsive in the ER with agonal respirations.  She does not have any gag reflex and she was intubated immediately by the ER physician for airway protection.  Temperature was 102.3 and pulse was 120 initially with a blood pressure 133/80.  Blood culture was obtained and the patient is initiated.  The patient is started on Zosyn and vancomycin.  Hospitalist team is called to admit the patient.  The patient is started on Versed drip for sedation.  White count is elevated and INR is at 2.1.  A 12-lead EKG has revealed sinus tachycardia.  No family members are available at bedside and the nurse was Morrie Sheldonshley at [facility] is reporting that the patient is scheduled to have an appointment regarding wound debridement of the left lower extremity.  PAST MEDICAL HISTORY:  Diabetes mellitus type 2, COPD, tobacco use, hypertension, peripheral vascular disease.   PAST SURGICAL  HISTORY:  Recent history of left-sided femoral-popliteal bypass grafting on wound vac and Coumadin.   ALLERGIES:  The patient is allergic to PREDNISONE.   PSYCHOSOCIAL HISTORY:  Currently residing in [facility].   MEDICATIONS AT HOME:  Tylenol 325 mg 2 tablets by mouth q. 6 hours, Toprol XL 25 mg once daily, Sertraline 50 mg once daily, potassium supplements, pantoprazole 40 mg once daily, oxycodone 5 mg by mouth q. 4 hours as needed, Norvasc 10 mg once daily, gabapentin 300 mg 2 times a day, diltiazem 90 mg half tablet every six hours, Coumadin 4 mg once daily, Colace 100 mg 2 times a day, aspirin 81 mg once daily, albuterol inhalation every four hours as needed for shortness of breath.   FAMILY HISTORY:  Unobtainable.   REVIEW OF SYSTEMS:  Unobtainable.   PHYSICAL EXAMINATION: VITAL SIGNS:  Temperature initially 102.3, pulse 114, respirations 12, blood pressure is 132/60, pulse ox 100% on vent.  GENERAL APPEARANCE:  Not under acute distress.  The patient is on vent, sedated.  HEENT:  Pupils pinpoint, sluggishly responding to light.  Dry mucous membranes.  NECK:  Supple.  No swelling is noted.  LUNGS:  Moderate air entry with positive crackles.  CARDIAC:  S1, S2 normal.  Regular rate and rhythm.  GASTROINTESTINAL:  Soft.  Bowel sounds are positive in all four quadrants.  No masses felt.  No hepatosplenomegaly.  NEUROLOGIC:  The patient is sedated from Versed drip.  Reflexes are 2+.  Motor and sensory could not be elicited.  SKIN:  Dry.  Warm to touch.  Decreased turgor.  No lesions noticed.  EXTREMITIES:  Left lower extremity with intact wound vac.  No cyanosis.  No clubbing. PSYCHIATRIC:  Mood and affect cannot be examined as the patient is sedated.   LABORATORY AND IMAGING STUDIES:  A 12-lead EKG, sinus tachycardia with premature atrial complexes.  Left axis deviation.  Normal PR and QRS intervals.  Glucose is 132, BUN 25, creatinine 2.35, sodium 137, potassium 5.1, chloride  105, CO2 15, GFR is 23, anion gap 17, serum osmolality 218, calcium 8.6.  LFTs, albumin is low at 2.5.  The rest of the LFTs are okay.  Troponin less than 0.02.  WBC 17.8, hemoglobin 9.8, hematocrit 29.1, platelets 499, PT 23.2.  Activated PTT 41.7.  Urinalysis yellow in color, clear in appearance nitrite and leukocyte esterase are negative.  The ABG has revealed a lactic acid of 1.10, pH 7.19, pCO2 37, pO2 133, on 80% FiO2, base excess minus 13.3, bicarb is 14.1.  Chest x-ray, possible developing infiltrate.   ASSESSMENT AND PLAN:  A 81 year old African American female sent over from [facility] for being lethargic and hypoxic and became unresponsive in the ER with agonal breathing, will be admitted with the following assessment and plan.  1.  Altered mental status, acute on chronic respiratory failure, sepsis, metabolic encephalopathy and acute kidney injury.  We will admit her to Intensive Care Unit.  The patient is intubated on the vent.  Continue Versed drip.  Get ABGs in a.m.  She is going to be nothing by mouth.  We will provide her with IV fluids.  Pan cultures were ordered.  Start her on broad-spectrum antibiotics Zosyn, Levaquin and vancomycin.  Sepsis could be from the wound infection versus aspiration pneumonia, septicemia.  Pan cultures are obtained and broad-spectrum antibiotics were ordered and we will de-escalate the antibiotics after getting the culture results.  2.  Peripheral vascular disease, recent history of femoropopliteal bypass surgery on wound vac and Coumadin.  INR is 2.1.  We will get daily PT INRs regarding Coumadin management.  Surgical consult is placed for possible wound debridement.  3.  Acute on chronic respiratory failure, possibly from aspiration pneumonia.  4.  Acute kidney injury.  We will provide her IV fluids and nephrology consult is placed.  5.  History of diabetes mellitus.  The patient is nothing by mouth.  We will put her on sliding scale insulin.  6.  She  is a FULL CODE according to the nursing home staff.     Total critical care time spent is 60 minutes.  We will notify family.     ____________________________ Ramonita Lab, MD ag:ea D: 03/11/2013 04:40:08 ET T: 03/11/2013 06:45:53 ET JOB#: [id number]  cc: Ramonita Lab, MD, <Dictator> Ramonita Lab MD ELECTRONICALLY SIGNED 03/15/2013 5:54

## 2014-11-22 NOTE — Discharge Summary (Signed)
PATIENT NAME:  Breanna Reed, Bettylou MR#:  [id number] DATE OF BIRTH:  March 07, 1933  DATE OF ADMISSION:  03/11/2013 DATE OF DISCHARGE:  03/15/2013  PRESENTING COMPLAINT: Altered mental status.   DISCHARGE DIAGNOSES: 1.  Acute encephalopathy secondary to septic shock due to right lower lobe aspiration pneumonia and low grade infection, left lower extremity status post wound VAC change.  2.  Severe peripheral vascular disease with recent history of left ischemic limb, underwent left lower extremity thromboembolectomy and developed left groin hematoma. [facility]. The patient currently has wound VAC. 3.  Acute on chronic respiratory failure from aspiration pneumonia, improved. The patient extubated. Was on mechanical ventilator, extubated 03/12/2013.  4.  Acute kidney injury/acute tubular necrosis in the setting of sepsis, improved.  5.  History of type 2 diabetes.  6.  Chronic obstructive pulmonary disease, currently off vent. 7.  Hypertension.  8.  Tachycardia, resolved.   CONDITION ON DISCHARGE: Fair.   CODE STATUS: The patient is FULL CODE.   DIET: Mechanical soft, ADA 1800 calorie diet. Glucerna shake, 237 mL b.i.d.   WOUND CARE: Wound VAC change biweekly, on Tuesdays and Fridays.   DISCHARGE INSTRUCTIONS: 1.  The patient to follow-up with [facility] vascular surgery for further follow-up on her wound VAC and left lower extremity surgery.  2.  PT and INR are to be checked once a week to ensure INR is therapeutic, between 2 and 3.  3.  Physical therapy as patient tolerates.   DISCHARGE MEDICATIONS: 1.  Metoprolol 12.5 mg b.i.d.  2.  Aspirin 81 mg daily.  3.  Zoloft 50 mg daily.  4.  Neurontin 300 mg b.i.d.  5.  DuoNebs q. 4 p.r.n.  6.  Atorvastatin 80 mg at bedtime.  7.  Lasix 20 mg daily.  8.  Colace 100 mg b.i.d.  9.  K-Dur 10 mEq p.o. daily.  10.  Protonix 40 mg daily.  11.  Roxicodone 5 mg q. 4 p.r.n.  12.  Coumadin 2 mg q. 11:00 p.m. Adjust  dosage according to INR.  13.  Levaquin 750 mg p.o. daily for 6 more days.  14.  Diltiazem 45 mg p.o. q. 6 hours.  15.  Sliding scale insulin.   BRIEF SUMMARY OF HOSPITAL COURSE:  Breanna RasLoretta Newsom is a 81 year old African American female who has history of type 2 diabetes, hypertension and severe peripheral vascular disease who comes into the Emergency Room after she was found lethargic, hypoxic and unresponsive with agonal breathing. She had fever of 102, tachypneic and tachycardic. She was intubated for airway protection and was admitted with:  1.  Septic shock, acute encephalopathy.  Likely secondary to right lower lobe aspiration pneumonia and possible some low-grade infection in her left lower extremity. The patient came in with a wound VAC that had gel-like, pinkish collection of serosanguineous discharge. She was intubated, kept on the vent and started on broad-spectrum antibiotics with vanc, Zosyn and Levaquin. It was changed to p.o. Levaquin which will cover her aspiration pneumonia and her left lower extremity wound. Blood cultures remained negative. White count trended down from 19,000 to 13,000. Her vancomycin was discontinued. She remained afebrile.  2.  Severe peripheral vascular disease with recent history of left ischemic limb. Underwent left lower extremity [facility]lect[facility] 01/29/2013 [facility]. Developed thereafter a large left groin hematoma and [facility]. She was sent to rehab with a wound VAC, readmitted on 07/21, and underwent extensive left lower extremity wound  debridement at [facility] with wound VAC placement. She was then discharged to [facility]. The patient has a wound VAC. She was on Coumadin for her severe peripheral vascular disease. Dr. Michela Pitcher was consulted. He changed the wound VAC, and the patient's wound started looking better, underneath the wound looks healthy, and her leg edema improved remarkably. The patient is recommended to  get wound VAC change biweekly and she will follow up with [facility] vascular surgery as her previous appointment since her initial surgeries were done at [facility].  3.  Acute on chronic respiratory failure, possibly from aspiration pneumonia. The patient got extubated on 03/12/2013. She was seen by Dr. Belia Heman. She will finish up a course of antibiotic for a total of 10 days.  4.  Acute kidney injury/ATN in the setting of sepsis. IV fluids were given. Her creatinine improved from 2 to 1.36.  5.  History of type 2 diabetes. Sugars are better. She was kept on sliding scale insulin.  6.  Hypertension. The patient was resumed back on her diltiazem 45 mg q. 6 hourly.  7.  COPD. The patient will be continued on her inhalers. There is no indication for steroids.  8.  DVT prophylaxis. Sub-Q heparin was given.   Hospital stay otherwise remained stable. The patient remained a FULL CODE.   TIME SPENT: 40 minutes. ____________________________ Wylie Hail Allena Katz, MD sap:sb D: 03/15/2013 14:01:38 ET T: 03/15/2013 14:30:02 ET JOB#: [id number]  cc: Noelani Harbach A. Allena Katz, MD, <Dictator> Willow Ora MD ELECTRONICALLY SIGNED 03/23/2013 5:44

## 2014-11-22 NOTE — Discharge Summary (Signed)
PATIENT NAME:  Breanna Reed, [name]:  [id number] DATE OF BIRTH:  1933-07-12  DATE OF ADMISSION:  03/11/2013 DATE OF DISCHARGE:    Addendum   The patient's discharge was suspended 03/15/2013, due to [facility] not having the patient's wound VAC. The social worker spoke with director of nursing at [facility], and they should have a wound VAC ordered to be used by the patient once she is discharged today back to [facility]. The patient is otherwise doing well and is ready for discharge to [facility] today.   TIME SPENT: 15 minutes.   ____________________________ Wylie HailSona A. Allena KatzPatel, MD sap:dmm D: 03/16/2013 10:26:36 ET T: 03/16/2013 10:39:41 ET JOB#: [id number]  cc: Albana Saperstein A. Allena KatzPatel, MD, <Dictator> Willow OraSONA A Rea Reser MD ELECTRONICALLY SIGNED 03/23/2013 5:44

## 2017-10-31 DEATH — deceased
